# Patient Record
Sex: Male | Born: 1975 | Race: White | Hispanic: No | Marital: Single | Smoking: Former smoker
Health system: Southern US, Community
[De-identification: ages and names within clinical notes are randomized; demographics above are authoritative.]

## PROBLEM LIST (undated history)

## (undated) DIAGNOSIS — M1990 Unspecified osteoarthritis, unspecified site: Secondary | ICD-10-CM

## (undated) DIAGNOSIS — F329 Major depressive disorder, single episode, unspecified: Secondary | ICD-10-CM

## (undated) DIAGNOSIS — F419 Anxiety disorder, unspecified: Secondary | ICD-10-CM

## (undated) DIAGNOSIS — K219 Gastro-esophageal reflux disease without esophagitis: Secondary | ICD-10-CM

## (undated) DIAGNOSIS — I10 Essential (primary) hypertension: Secondary | ICD-10-CM

## (undated) DIAGNOSIS — F32A Depression, unspecified: Secondary | ICD-10-CM

## (undated) HISTORY — PX: CIRCUMCISION: SUR203

## (undated) HISTORY — DX: Depression, unspecified: F32.A

## (undated) HISTORY — DX: Major depressive disorder, single episode, unspecified: F32.9

## (undated) HISTORY — DX: Essential (primary) hypertension: I10

## (undated) HISTORY — DX: Gastro-esophageal reflux disease without esophagitis: K21.9

## (undated) HISTORY — DX: Unspecified osteoarthritis, unspecified site: M19.90

## (undated) HISTORY — DX: Anxiety disorder, unspecified: F41.9

---

## 2017-07-26 ENCOUNTER — Encounter: Payer: Self-pay | Admitting: Gastroenterology

## 2017-08-01 ENCOUNTER — Encounter: Payer: Self-pay | Admitting: Gastroenterology

## 2017-09-29 ENCOUNTER — Encounter (INDEPENDENT_AMBULATORY_CARE_PROVIDER_SITE_OTHER): Payer: Self-pay

## 2017-09-29 ENCOUNTER — Other Ambulatory Visit (INDEPENDENT_AMBULATORY_CARE_PROVIDER_SITE_OTHER): Payer: Medicare Other

## 2017-09-29 ENCOUNTER — Ambulatory Visit: Payer: Medicare Other | Admitting: Gastroenterology

## 2017-09-29 ENCOUNTER — Encounter: Payer: Self-pay | Admitting: Gastroenterology

## 2017-09-29 VITALS — BP 128/88 | HR 78 | Ht 70.0 in | Wt 222.5 lb

## 2017-09-29 DIAGNOSIS — R7989 Other specified abnormal findings of blood chemistry: Secondary | ICD-10-CM

## 2017-09-29 DIAGNOSIS — R7401 Elevation of levels of liver transaminase levels: Secondary | ICD-10-CM

## 2017-09-29 DIAGNOSIS — K219 Gastro-esophageal reflux disease without esophagitis: Secondary | ICD-10-CM | POA: Diagnosis not present

## 2017-09-29 DIAGNOSIS — K529 Noninfective gastroenteritis and colitis, unspecified: Secondary | ICD-10-CM

## 2017-09-29 DIAGNOSIS — R740 Nonspecific elevation of levels of transaminase and lactic acid dehydrogenase [LDH]: Secondary | ICD-10-CM | POA: Diagnosis not present

## 2017-09-29 DIAGNOSIS — R945 Abnormal results of liver function studies: Secondary | ICD-10-CM | POA: Diagnosis not present

## 2017-09-29 DIAGNOSIS — R197 Diarrhea, unspecified: Secondary | ICD-10-CM | POA: Diagnosis not present

## 2017-09-29 LAB — HEPATIC FUNCTION PANEL
ALT: 44 U/L (ref 0–53)
AST: 30 U/L (ref 0–37)
Albumin: 4.5 g/dL (ref 3.5–5.2)
Alkaline Phosphatase: 71 U/L (ref 39–117)
BILIRUBIN TOTAL: 0.4 mg/dL (ref 0.2–1.2)
Bilirubin, Direct: 0.1 mg/dL (ref 0.0–0.3)
Total Protein: 7.3 g/dL (ref 6.0–8.3)

## 2017-09-29 LAB — IGA: IgA: 318 mg/dL (ref 68–378)

## 2017-09-29 LAB — FERRITIN: FERRITIN: 133.4 ng/mL (ref 22.0–322.0)

## 2017-09-29 LAB — GAMMA GT: GGT: 31 U/L (ref 7–51)

## 2017-09-29 LAB — HIGH SENSITIVITY CRP: CRP, High Sensitivity: 1.29 mg/L (ref 0.000–5.000)

## 2017-09-29 LAB — TSH: TSH: 0.92 u[IU]/mL (ref 0.35–4.50)

## 2017-09-29 NOTE — Progress Notes (Signed)
[place] VISIT   Primary Care Provider Katherina Mires, MD [address] [place] [id number] [phone]  Referring Provider Katherina Mires, MD [place], [place] [address] [phone]  Patient Profile: Steven Shaw is a 42 y.o. male with a pmh significant for Schizophrenia, MDD/Anxiety, HTN, GERD, OA.  The patient presents to the [facility] for an evaluation and management of problem(s) noted below:  Problem List 1. Chronic diarrhea of unknown origin   2. Elevated LFTs   3. Elevated AST (SGOT)   4. Gastroesophageal reflux disease, esophagitis presence not specified     History of Present Illness: This is the patient's first visit to the GI [place] clinic.  He is being seen for evaluation of chronic diarrhea.  Patient describes experiencing for the last 5 to 6 years loose, diarrheal bowel movements on a daily basis.  He has between 3-4 bowel movements per day that are extremely soft Bristol scale 6 and it is very infrequent when it is a Social worker scale 7 or watery.  He describes no significant abdominal pain or discomfort associated with his diarrhea/increased bowel movements.  He does relate a relatively quick gastrocolic reflex such that within 15 to 20 minutes of eating he will usually have a bowel movement.  He had no melena or hematochezia.  He trialed fiber supplementation about once daily for about 1 month and felt that his stools became of slightly more bulky but he still had as frequent a number of bowel movements per day that he began to try and decrease his fiber intake to what it is now only 2 times weekly to see if he truly noticed a change in the frequency.  He is only tried Metamucil.  The patient does not describe any significant travel history outside of the country.  He does not have anyone in his family or anyone around him who is had similar manifestations of increased  frequency/diarrhea.  He does describe a long-standing history of acid reflux.  His acid reflux/pyrosis is something that occurs if he does not watch his diet closely.  He has been on a PPI and while on it has not had any breakthrough symptoms.  He denies any overt dysphagia or odynophagia.  He has never had an upper or lower endoscopy.  The patient denies any symptoms of decompensated liver disease including pruritus, deepening jaundice, dark and urine, confusion.  He has never had an abdominal ultrasound or CT of his abdomen.  If no family history of liver disease.  GI Review of Systems Positive as above Negative for change in appetite, weight loss, nausea, vomiting  Review of Systems General: Denies fevers/chills/weight loss HEENT: Denies oral lesions Cardiovascular: Denies chest pain Pulmonary: Denies shortness of breath Gastroenterological: See HPI Genitourinary: Denies hematuria or darkened urine Hematological: Denies easy bruising/bleeding Endocrine: Denies temperature intolerance Dermatological: Denies skin changes Psychological: He feels mood is stable on current medication dosing Musculoskeletal: Denies any new arthralgias   Medications Current Outpatient Medications  Medication Sig Dispense Refill  . FLUoxetine (PROZAC) 20 MG capsule Take 40 mg by mouth daily.    Marland Kitchen omeprazole (PRILOSEC) 20 MG capsule Take 20 mg by mouth daily.    . QUEtiapine (SEROQUEL) 400 MG tablet Take 800 mg by mouth at bedtime.    . risperidone (RISPERDAL) 4 MG tablet Take 4 mg by mouth daily.     No current facility-administered medications for this visit.    Allergies  No Known Allergies  Histories Past Medical History:  Diagnosis Date  . Anxiety   . Arthritis   . Depressed   . High blood pressure    History reviewed. No pertinent surgical history. Social History   Socioeconomic History  . Marital status: Single    Spouse name: Not on file  . Number of children: 0  . Years of education:  Not on file  . Highest education level: Not on file  Occupational History  . Occupation: Disabled  Social Needs  . Financial resource strain: Not on file  . Food insecurity:    Worry: Not on file    Inability: Not on file  . Transportation needs:    Medical: Not on file    Non-medical: Not on file  Tobacco Use  . Smoking status: Former Research scientist (life sciences)  . Smokeless tobacco: Never Used  Substance and Sexual Activity  . Alcohol use: Never    Frequency: Never  . Drug use: Yes  . Sexual activity: Not on file  Lifestyle  . Physical activity:    Days per week: Not on file    Minutes per session: Not on file  . Stress: Not on file  Relationships  . Social connections:    Talks on phone: Not on file    Gets together: Not on file    Attends religious service: Not on file    Active member of club or organization: Not on file    Attends meetings of clubs or organizations: Not on file    Relationship status: Not on file  . Intimate partner violence:    Fear of current or ex partner: Not on file    Emotionally abused: Not on file    Physically abused: Not on file    Forced sexual activity: Not on file  Other Topics Concern  . Not on file  Social History Narrative  . Not on file   Family History  Problem Relation Age of Onset  . Colon cancer Neg Hx   . Esophageal cancer Neg Hx   . Irritable bowel syndrome Neg Hx   . Liver disease Neg Hx   . Pancreatic cancer Neg Hx    I have reviewed his medical, social, and family history in detail and updated the electronic medical record as necessary.    PHYSICAL EXAMINATION  BP 128/88   Pulse 78   Ht 5\' 10"  (1.778 m)   Wt 222 lb 8 oz (100.9 kg)   BMI 31.93 kg/m  GEN: NAD, appears stated age, doesn't appear chronically ill PSYCH: Cooperative, without pressured speech EYE: Conjunctivae pink, sclerae anicteric ENT: MMM, without oral ulcers, no erythema or exudates noted NECK: Supple, without thyromegaly, no JVD CV: RR without R/Gs  RESP:  CTAB posteriorly, without wheezing GI: NABS, soft, NT/ND, without rebound or guarding, no HSM appreciated GU: DRE shows MSK/EXT: _ edema, no palmar erythema, no Dupuytren's contractures SKIN: No concerning rashes/lesions, no spider angiomata HEME: No LAD appreciated NEURO:  Alert & Oriented x 3, no focal deficits, no evidence of asterixis   REVIEW OF DATA  I reviewed the following data at the time of this encounter:  GI Procedures and Studies  None to review  Laboratory Studies  Reviewed in Care Everywhere  Imaging Studies  No relevant studies   ASSESSMENT  Mr. Costanzo is a 42 y.o. male with a pmh significant for Schizophrenia, MDD/Anxiety, HTN, GERD, OA.  The patient is seen today for evaluation and management of:  1. Chronic  diarrhea of unknown origin   2. Elevated LFTs   3. Elevated AST (SGOT)   4. Gastroesophageal reflux disease, esophagitis presence not specified    This is a hemodynamically stable patient who is being seen evaluation for abnormal liver biochemical testing as well as chronic diarrhea.  In regards to his chronic diarrhea which is the patient's main concern he describes an increased frequency of looser/softer bowel movements that have been ongoing for the last 5 to 6 years.  The etiology of this change in his bowel habits when he previously was only going 1-2 times per day is not clearly delineated just with the history that he is given.  We will plan to obtain stool studies to rule out an infectious etiology (this is very unlikely based on the chronicity of things).  We will also plan to increase his fiber supplementation over the course the next few weeks.  He has not had overt red flag symptoms in regards to any blood loss or blood being seen and his weight has been stable.  Thus I do not think he is having a malabsorptive process.  We will work-up metabolic etiologies as per laboratory evaluation noted below.  If after the next 6 weeks he continues to have  significant loose stools we will consider an endoscopic evaluation to rule out microscopic colitis.  I do not think he has SIBO but we will keep that in mind as well.  He has not used antimotility agents which I think is okay at this point in time but may need to consider if this turns out to be a functional diarrhea.  In regards to his abnormal liver biochemical testing he only has 2 sets of labs and describes no other history of having had abnormal liver biochemical testing in the past.  Does have a history of prior drug use though has been found to be hepatitis C and hepatitis B antibody negative.  No significant alcohol consumption or family history of liver disease is noted.  I suspect that he likely has an element of nonalcoholic fatty liver disease.  We will plan to obtain a basic work-up for abnormal liver biochemical testing and plan for a liver ultrasound.  If the patient continues to have waxing/waning liver test abnormalities over the course the next 6 to 12 months and he remained stable at that point I would strongly suggest the consideration of a liver biopsy and additional testing as necessary.  All questions were answered to the best of my ability and the patient agrees with this plan of action with follow-up as outlined below.   PLAN  1. Chronic diarrhea of unknown origin - Stool culture; Future - Ova and parasite examination; Future - Clostridium difficile Toxin B, Qualitative, Real-Time PCR; Future - Gastrointestinal Pathogen Panel PCR; Future - TSH; Future - Increase Fiber to twice daily - Keep a diarrhea diary - If no etiology is found or symptoms persist, will consider Colonoscopy with biopsies  2. Elevated LFTs - Hepatic function panel; Future - High sensitivity CRP; Future - Ferritin; Future - Tissue transglutaminase, IgA; Future - IgA; Future - Gamma GT; Future - Mitochondrial antibodies; Future - ANA; Future - Anti-smooth muscle antibody, IgG; Future - Hepatitis B  core antibody, total; Future - Hepatitis B surface antibody,qualitative; Future - Hepatitis A antibody, total; Future - US Abdomen Limited RUQ; Future  3. Elevated AST (SGOT)  4. Gastroesophageal reflux disease, esophagitis presence not specified - Continue current PPI dosing - No need for endoscopic  evaluation at this point in time for reflux   Orders Placed This Encounter  Procedures  . Stool culture  . Ova and parasite examination  . US Abdomen Limited RUQ  . Hepatic function panel  . TSH  . High sensitivity CRP  . Ferritin  . Tissue transglutaminase, IgA  . IgA  . Gamma GT  . Mitochondrial antibodies  . ANA  . Anti-smooth muscle antibody, IgG  . Hepatitis B core antibody, total  . Hepatitis B surface antibody,qualitative  . Hepatitis A antibody, total  . Clostridium difficile Toxin B, Qualitative, Real-Time PCR  . Gastrointestinal Pathogen Panel PCR    New Prescriptions   No medications on file   Modified Medications   No medications on file    Planned Follow Up: No follow-ups on file.   Justice Britain, MD [facility] Office # [phone]

## 2017-09-29 NOTE — Patient Instructions (Signed)
Your provider has requested that you go to the basement level for lab work before leaving today. Press "B" on the elevator. The lab is located at the first door on the left as you exit the elevator.  You have been scheduled for an abdominal ultrasound at [facility] (1st floor of hospital) on 10/04/17 at 8am. Please arrive 15 minutes prior to your appointment for registration. Make certain not to have anything to eat or drink 6 hours prior to your appointment. Should you need to reschedule your appointment, please contact radiology at [phone]. This test typically takes about 30 minutes to perform.  Increase Metammcil to twice daily  We have scheduled you for a follow up appointment with Dr Rush Landmark on 11/09/17 at 11am  Normal BMI (Body Mass Index- based on height and weight) is between 19 and 25. Your BMI today is Body mass index is 31.93 kg/m. Marland Kitchen Please consider follow up  regarding your BMI with your Primary Care Provider.

## 2017-10-01 ENCOUNTER — Encounter: Payer: Self-pay | Admitting: Gastroenterology

## 2017-10-04 ENCOUNTER — Ambulatory Visit (HOSPITAL_COMMUNITY)
Admission: RE | Admit: 2017-10-04 | Discharge: 2017-10-04 | Disposition: A | Discharge status: Home / Self Care | Payer: Medicare Other | Source: Ambulatory Visit | Attending: Gastroenterology | Admitting: Gastroenterology

## 2017-10-04 DIAGNOSIS — K219 Gastro-esophageal reflux disease without esophagitis: Secondary | ICD-10-CM | POA: Diagnosis present

## 2017-10-04 DIAGNOSIS — K760 Fatty (change of) liver, not elsewhere classified: Secondary | ICD-10-CM | POA: Diagnosis not present

## 2017-10-04 DIAGNOSIS — R7989 Other specified abnormal findings of blood chemistry: Secondary | ICD-10-CM

## 2017-10-04 DIAGNOSIS — K529 Noninfective gastroenteritis and colitis, unspecified: Secondary | ICD-10-CM | POA: Insufficient documentation

## 2017-10-04 DIAGNOSIS — R945 Abnormal results of liver function studies: Secondary | ICD-10-CM | POA: Insufficient documentation

## 2017-10-05 ENCOUNTER — Encounter: Payer: Self-pay | Admitting: Gastroenterology

## 2017-10-09 ENCOUNTER — Encounter: Payer: Self-pay | Admitting: Gastroenterology

## 2017-10-09 DIAGNOSIS — R768 Other specified abnormal immunological findings in serum: Secondary | ICD-10-CM | POA: Insufficient documentation

## 2017-10-09 NOTE — Progress Notes (Signed)
Please let patient know that most of his lab results from a liver perspective were normal. His elevation in the AMA antibody can go along with some Liver disorders, but Primary Biliary Cholangitis seems much less likely in his age group/gender/situation. We will hold on a liver biopsy for now, but I will make notation in chart about positive AMA.  We will consider role of a MRI/MRCP after his next visit. In regards to his stool studies they are all negative.  Based on how he is doing overall at next visit to consider role of endoscopic evaluation. Thank you.

## 2017-10-10 LAB — ANTI-SMOOTH MUSCLE ANTIBODY, IGG

## 2017-10-10 LAB — STOOL CULTURE
MICRO NUMBER: 91082454
MICRO NUMBER: 91082455
MICRO NUMBER:: 91082457
SHIGA RESULT: NOT DETECTED
SPECIMEN QUALITY: ADEQUATE
SPECIMEN QUALITY: ADEQUATE
SPECIMEN QUALITY:: ADEQUATE

## 2017-10-10 LAB — CLOSTRIDIUM DIFFICILE TOXIN B, QUALITATIVE, REAL-TIME PCR

## 2017-10-10 LAB — OVA AND PARASITE EXAMINATION
CONCENTRATE RESULT:: NONE SEEN
MICRO NUMBER:: 91082456
SPECIMEN QUALITY: ADEQUATE
TRICHROME RESULT: NONE SEEN

## 2017-10-10 LAB — GASTROINTESTINAL PATHOGEN PANEL PCR
C. difficile Tox A/B, PCR: NOT DETECTED
CAMPYLOBACTER, PCR: NOT DETECTED
Cryptosporidium, PCR: NOT DETECTED
E COLI (ETEC) LT/ST, PCR: NOT DETECTED
E coli (STEC) stx1/stx2, PCR: NOT DETECTED
E coli 0157, PCR: NOT DETECTED
GIARDIA LAMBLIA, PCR: NOT DETECTED
Norovirus, PCR: NOT DETECTED
ROTAVIRUS, PCR: NOT DETECTED
SHIGELLA, PCR: NOT DETECTED
Salmonella, PCR: NOT DETECTED

## 2017-10-10 LAB — MITOCHONDRIAL ANTIBODIES: Mitochondrial M2 Ab, IgG: 61.4 U — ABNORMAL HIGH

## 2017-10-10 LAB — HEPATITIS B CORE ANTIBODY, TOTAL: Hep B Core Total Ab: NONREACTIVE

## 2017-10-10 LAB — ANA: Anti Nuclear Antibody(ANA): NEGATIVE

## 2017-10-10 LAB — TISSUE TRANSGLUTAMINASE, IGA: (tTG) Ab, IgA: 1 U/mL

## 2017-10-10 LAB — HEPATITIS B SURFACE ANTIBODY,QUALITATIVE: HEP B S AB: NONREACTIVE

## 2017-10-10 LAB — HEPATITIS A ANTIBODY, TOTAL: HEPATITIS A AB,TOTAL: REACTIVE — AB

## 2017-11-09 ENCOUNTER — Encounter (INDEPENDENT_AMBULATORY_CARE_PROVIDER_SITE_OTHER): Payer: Self-pay

## 2017-11-09 ENCOUNTER — Ambulatory Visit (INDEPENDENT_AMBULATORY_CARE_PROVIDER_SITE_OTHER): Payer: Medicare Other | Admitting: Gastroenterology

## 2017-11-09 ENCOUNTER — Encounter: Payer: Self-pay | Admitting: Gastroenterology

## 2017-11-09 VITALS — BP 134/90 | HR 80 | Ht 70.0 in | Wt 226.0 lb

## 2017-11-09 DIAGNOSIS — K760 Fatty (change of) liver, not elsewhere classified: Secondary | ICD-10-CM

## 2017-11-09 DIAGNOSIS — R945 Abnormal results of liver function studies: Secondary | ICD-10-CM | POA: Diagnosis not present

## 2017-11-09 DIAGNOSIS — K529 Noninfective gastroenteritis and colitis, unspecified: Secondary | ICD-10-CM | POA: Diagnosis not present

## 2017-11-09 DIAGNOSIS — R7989 Other specified abnormal findings of blood chemistry: Secondary | ICD-10-CM

## 2017-11-09 MED ORDER — PEG 3350-KCL-NABCB-NACL-NASULF 236 G PO SOLR: 4000.0000 mL | Freq: Once | ORAL | 0 refills | Status: AC

## 2017-11-09 NOTE — Progress Notes (Signed)
[name] VISIT   Primary Care Provider Katherina Mires, MD [place] [address] [phone]  Patient Profile: Steven Shaw is a 42 y.o. male with a pmh significant for Schizophrenia, MDD/Anxiety, HTN, GERD, OA, Chronic Diarrhea.  The patient presents to the [facility] for an evaluation and management of problem(s) noted below:  Problem List 1. Chronic diarrhea   2. Fatty liver   3. Elevated LFTs     History of Present Illness: Please see initial consultation/outpatient referral for full details of present illness.  In brief, the aptient has had loose stools for the last 5-6 years occurring daily.  Normal Bristol scale stools have been within the 6-7 range.  His symptoms have been related to a relatively quick gastrocolic reflex.  No history of significant hematochezia or melena.  Intermittent fiber was not helpful.  His pyrosis symptoms have been diet controlled while on PPI.  When last seen decision was made to move forward with serolgica and stool studies to work up initial set of possibilities and these have been negative to date.    Interval Hisotry: The patient returns today and is feeling well and grossly unchanged in regards to his overall number of bowel movements daily.  He has continued to use Fiber supplementation twice daily and although there have been times where there was some form to the stools, they remain mostly Bristol 5-6 in quality.  He is having between 3-5 bowel movements daily.  Remains free of any blood.  His weight has been stable.  His appetite remains well and weight is been stable.  GI Review of Systems Positive as above Negative for weight loss, nausea, vomiting, melena, hematochezia  Review of Systems General: Denies fevers/chills Cardiovascular: Denies chest pain/palpitations Pulmonary: Denies shortness of breath Gastroenterological: See HPI Genitourinary: Denies dark  and urine Psychological: Mood is stable   Medications Current Outpatient Medications  Medication Sig Dispense Refill  . FLUoxetine (PROZAC) 20 MG capsule Take 40 mg by mouth daily.    Marland Kitchen omeprazole (PRILOSEC) 20 MG capsule Take 20 mg by mouth daily.    . QUEtiapine (SEROQUEL) 400 MG tablet Take 800 mg by mouth at bedtime.    . risperidone (RISPERDAL) 4 MG tablet Take 4 mg by mouth daily.     No current facility-administered medications for this visit.    Allergies No Known Allergies  Histories Past Medical History:  Diagnosis Date  . Anxiety   . Arthritis   . Depressed   . High blood pressure    History reviewed. No pertinent surgical history. Social History   Socioeconomic History  . Marital status: Single    Spouse name: Not on file  . Number of children: 0  . Years of education: Not on file  . Highest education level: Not on file  Occupational History  . Occupation: Disabled  Social Needs  . Financial resource strain: Not on file  . Food insecurity:    Worry: Not on file    Inability: Not on file  . Transportation needs:    Medical: Not on file    Non-medical: Not on file  Tobacco Use  . Smoking status: Former Research scientist (life sciences)  . Smokeless tobacco: Never Used  Substance and Sexual Activity  . Alcohol use: Never    Frequency: Never  . Drug use: Yes    Types: Marijuana, Cocaine  . Sexual activity: Not on file  Lifestyle  . Physical activity:    Days  per week: Not on file    Minutes per session: Not on file  . Stress: Not on file  Relationships  . Social connections:    Talks on phone: Not on file    Gets together: Not on file    Attends religious service: Not on file    Active member of club or organization: Not on file    Attends meetings of clubs or organizations: Not on file    Relationship status: Not on file  . Intimate partner violence:    Fear of current or ex partner: Not on file    Emotionally abused: Not on file    Physically abused: Not on file     Forced sexual activity: Not on file  Other Topics Concern  . Not on file  Social History Narrative  . Not on file   Family History  Problem Relation Age of Onset  . Colon cancer Neg Hx   . Esophageal cancer Neg Hx   . Irritable bowel syndrome Neg Hx   . Liver disease Neg Hx   . Pancreatic cancer Neg Hx   . Inflammatory bowel disease Neg Hx   . Rectal cancer Neg Hx   . Stomach cancer Neg Hx    I have reviewed his medical, social, and family history in detail and updated the electronic medical record as necessary.    PHYSICAL EXAMINATION  BP 134/90   Pulse 80   Ht 5\' 10"  (1.778 m)   Wt 226 lb (102.5 kg)   BMI 32.43 kg/m  GEN: NAD, appears stated age, doesn't appear chronically ill PSYCH: Cooperative, without pressured speech EYE: Conjunctivae pink, sclerae anicteric ENT: MMM, without oral ulcers NECK: Supple CV: RR without R/Gs  RESP: CTAB posteriorly, without wheezing GI: NABS, soft, NT/ND, without rebound or guarding, no HSM appreciated MSK/EXT: No lower extremity edema SKIN: No jaundice NEURO:  Alert & Oriented x 3, no focal deficitss   REVIEW OF DATA  I reviewed the following data at the time of this encounter:  GI Procedures and Studies  None to review  Laboratory Studies  Reviewed in EPIC  Imaging Studies  9/19 RUQUS IMPRESSION: 1. Changes of hepatic steatosis. No focal hepatic abnormality other than some sparing near the gallbladder. 2. No gallstones.   ASSESSMENT  Steven Shaw is a 42 y.o. male  with a pmh significant for Schizophrenia, MDD/Anxiety, HTN, GERD, OA, Chronic Diarrhea.  The patient is seen today for evaluation and management of:  1. Chronic diarrhea   2. Fatty liver   3. Elevated LFTs    The patient remains hemodynamically stable since his prior evaluation.  His diarrhea has not changed significantly since his initial evaluation prior consultation note has been long-standing overall.  As we have not found an etiology as of yet it is  necessary to proceed with a colonoscopy to further evaluate patient for etiologies such as microscopic colitis and inflammatory bowel disease.  If this work-up is negative I would consider breath testing although he is not the typical patient I would consider an etiology for SIBO to be playing a role but I think it would be reasonable in his work-up if necessary.  The risks and benefits of endoscopic evaluation were discussed with the patient; these include but are not limited to the risk of perforation, infection, bleeding, missed lesions, lack of diagnosis, severe illness requiring hospitalization, as well as anesthesia and sedation related illnesses.  The patient is agreeable to proceed.  We also discussed that  on his last evaluation he had liver tests that had improvement and will no longer in the above normal range.  With that being said his transferases are not in the teens or 20s and thus based on AASLD guidelines would be still slightly abnormal.  He had his laboratory work-up a positive AMA.  We obtained a right upper quadrant ultrasound that showed evidence of fatty liver disease.  I do not think a liver biopsy is required at this point in time since he is now in a more normal range of his liver tests.  We discussed that if his liver numbers were to fluctuate again in the future then it may be reasonable to consider a liver biopsy.  Otherwise we will continue continue to maintain a healthy lifestyle and hopefully work on his weight to decrease his chance of having inflammation as a result of fatty liver.  We will not pursue a MRI yet for his positive ANA because I do not think he is someone who has primary biliary cholangitis based on his age and sex.  All patient questions were answered, to the best of my ability, and the patient agrees to the aforementioned plan of action with follow-up as indicated.   PLAN  1. Chronic diarrhea - Colonoscopy to rule out IBD and Microscopic colitis - Consider  Pancreatic fecal elastase for future testing. - Maintain Fiber twice daily supplementation - Keep a diarrhea diary  2. Fatty liver - Lifestyle modifications for continued weight loss - Consider Mediterranean Diet - Alcohol minimization  3. Elevated LFTs - Likely Fatty Liver Disease - Hold on Liver biopsy for now - Hold on MRI/MRCP for now unless LFTs derange - Unlikely PBC even with positive AMA    Orders Placed This Encounter  Procedures  . Ambulatory referral to Gastroenterology    New Prescriptions   No medications on file   Modified Medications   No medications on file    Planned Follow Up: No follow-ups on file.   Justice Britain, MD [facility] Office # [phone]

## 2017-11-09 NOTE — Patient Instructions (Signed)

## 2017-11-11 ENCOUNTER — Encounter: Payer: Self-pay | Admitting: Gastroenterology

## 2017-11-11 DIAGNOSIS — K760 Fatty (change of) liver, not elsewhere classified: Secondary | ICD-10-CM | POA: Insufficient documentation

## 2017-12-07 ENCOUNTER — Telehealth: Payer: Self-pay | Admitting: Gastroenterology

## 2017-12-07 NOTE — Telephone Encounter (Signed)
Advised Dr. Rush Landmark in person that this patient had cancelled his procedure for tomorrow due to insurance issues.  He is aware now.

## 2017-12-08 ENCOUNTER — Encounter: Payer: Medicare Other | Admitting: Gastroenterology

## 2017-12-16 ENCOUNTER — Ambulatory Visit: Payer: Medicare Other | Admitting: Gastroenterology

## 2018-09-21 ENCOUNTER — Encounter: Payer: Self-pay | Admitting: Gastroenterology

## 2018-10-04 ENCOUNTER — Other Ambulatory Visit: Payer: Self-pay

## 2018-10-04 ENCOUNTER — Ambulatory Visit (AMBULATORY_SURGERY_CENTER): Payer: Self-pay | Admitting: *Deleted

## 2018-10-04 VITALS — Temp 96.0°F | Ht 70.0 in | Wt 229.0 lb

## 2018-10-04 DIAGNOSIS — R945 Abnormal results of liver function studies: Secondary | ICD-10-CM

## 2018-10-04 DIAGNOSIS — R7989 Other specified abnormal findings of blood chemistry: Secondary | ICD-10-CM

## 2018-10-04 DIAGNOSIS — K529 Noninfective gastroenteritis and colitis, unspecified: Secondary | ICD-10-CM

## 2018-10-04 MED ORDER — PEG 3350-KCL-NA BICARB-NACL 420 G PO SOLR: 4000.0000 mL | Freq: Once | ORAL | 0 refills | Status: AC

## 2018-10-04 NOTE — Progress Notes (Signed)
No egg or soy allergy known to patient  No issues with past sedation with any surgeries  or procedures, no intubation problems  No diet pills per patient No home 02 use per patient  No blood thinners per patient  Pt denies issues with constipation  No A fib or A flutter  EMMI video sent to pt's e mail  

## 2018-10-05 ENCOUNTER — Encounter: Payer: Self-pay | Admitting: Gastroenterology

## 2018-10-18 ENCOUNTER — Telehealth: Payer: Self-pay

## 2018-10-18 NOTE — Telephone Encounter (Signed)
Pt answered NO to all covid Questions °

## 2018-10-18 NOTE — Telephone Encounter (Signed)
Covid-19 screening questions   Do you now or have you had a fever in the last 14 days?  Do you have any respiratory symptoms of shortness of breath or cough now or in the last 14 days?  Do you have any family members or close contacts with diagnosed or suspected Covid-19 in the past 14 days?  Have you been tested for Covid-19 and found to be positive?       

## 2018-10-19 ENCOUNTER — Other Ambulatory Visit: Payer: Self-pay

## 2018-10-19 ENCOUNTER — Ambulatory Visit (AMBULATORY_SURGERY_CENTER): Payer: Medicare HMO | Admitting: Gastroenterology

## 2018-10-19 ENCOUNTER — Encounter: Payer: Self-pay | Admitting: Gastroenterology

## 2018-10-19 VITALS — BP 119/67 | HR 77 | Temp 98.2°F | Resp 12 | Ht 70.0 in | Wt 229.0 lb

## 2018-10-19 DIAGNOSIS — R7989 Other specified abnormal findings of blood chemistry: Secondary | ICD-10-CM

## 2018-10-19 DIAGNOSIS — D123 Benign neoplasm of transverse colon: Secondary | ICD-10-CM

## 2018-10-19 DIAGNOSIS — K2980 Duodenitis without bleeding: Secondary | ICD-10-CM

## 2018-10-19 DIAGNOSIS — K529 Noninfective gastroenteritis and colitis, unspecified: Secondary | ICD-10-CM

## 2018-10-19 DIAGNOSIS — K648 Other hemorrhoids: Secondary | ICD-10-CM

## 2018-10-19 DIAGNOSIS — K3189 Other diseases of stomach and duodenum: Secondary | ICD-10-CM

## 2018-10-19 MED ORDER — SODIUM CHLORIDE 0.9 % IV SOLN: 500.0000 mL | Freq: Once | INTRAVENOUS | Status: DC

## 2018-10-19 NOTE — Progress Notes (Signed)
Report to PACU, RN, vss, BBS= Clear.  

## 2018-10-19 NOTE — Progress Notes (Signed)
Called to room to assist during endoscopic procedure.  Patient ID and intended procedure confirmed with present staff. Received instructions for my participation in the procedure from the performing physician.  

## 2018-10-19 NOTE — Patient Instructions (Signed)
Please see handouts given to you on Polyps, Hemorrhoids.     YOU HAD AN ENDOSCOPIC PROCEDURE TODAY AT [facility]:   Refer to the procedure report that was given to you for any specific questions about what was found during the examination.  If the procedure report does not answer your questions, please call your gastroenterologist to clarify.  If you requested that your care partner not be given the details of your procedure findings, then the procedure report has been included in a sealed envelope for you to review at your convenience later.  YOU SHOULD EXPECT: Some feelings of bloating in the abdomen. Passage of more gas than usual.  Walking can help get rid of the air that was put into your GI tract during the procedure and reduce the bloating. If you had a lower endoscopy (such as a colonoscopy or flexible sigmoidoscopy) you may notice spotting of blood in your stool or on the toilet paper. If you underwent a bowel prep for your procedure, you may not have a normal bowel movement for a few days.  Please Note:  You might notice some irritation and congestion in your nose or some drainage.  This is from the oxygen used during your procedure.  There is no need for concern and it should clear up in a day or so.  SYMPTOMS TO REPORT IMMEDIATELY:   Following lower endoscopy (colonoscopy or flexible sigmoidoscopy):  Excessive amounts of blood in the stool  Significant tenderness or worsening of abdominal pains  Swelling of the abdomen that is new, acute  Fever of 100F or higher   Following upper endoscopy (EGD)  Vomiting of blood or coffee ground material  New chest pain or pain under the shoulder blades  Painful or persistently difficult swallowing  New shortness of breath  Fever of 100F or higher  Black, tarry-looking stools  For urgent or emergent issues, a gastroenterologist can be reached at any hour by calling [phone].   DIET:  We do recommend a small meal  at first, but then you may proceed to your regular diet.  Drink plenty of fluids but you should avoid alcoholic beverages for 24 hours.  ACTIVITY:  You should plan to take it easy for the rest of today and you should NOT DRIVE or use heavy machinery until tomorrow (because of the sedation medicines used during the test).    FOLLOW UP: Our staff will call the number listed on your records 48-72 hours following your procedure to check on you and address any questions or concerns that you may have regarding the information given to you following your procedure. If we do not reach you, we will leave a message.  We will attempt to reach you two times.  During this call, we will ask if you have developed any symptoms of COVID 19. If you develop any symptoms (ie: fever, flu-like symptoms, shortness of breath, cough etc.) before then, please call [phone].  If you test positive for Covid 19 in the 2 weeks post procedure, please call and report this information to [place].    If any biopsies were taken you will be contacted by phone or by letter within the next 1-3 weeks.  Please call us at [phone] if you have not heard about the biopsies in 3 weeks.    SIGNATURES/CONFIDENTIALITY: You and/or your care partner have signed paperwork which will be entered into your electronic medical record.  These signatures attest to the fact that that  the information above on your After Visit Summary has been reviewed and is understood.  Full responsibility of the confidentiality of this discharge information lies with you and/or your care-partner.  Thank you for letting us take care of your healthcare needs today.

## 2018-10-19 NOTE — Progress Notes (Signed)
Pt's states no medical or surgical changes since previsit or office visit.  Temp taken by J[facility] VS taken by [facility]

## 2018-10-19 NOTE — Op Note (Signed)
[place] Patient Name: Steven Shaw Procedure Date: 10/19/2018 10:35 AM MRN: [id number] Endoscopist: Justice Britain , MD Age: 43 Referring MD:  Date of Birth: [birth year] Gender: Male Account #: [id number] Procedure:                Colonoscopy Indications:              Chronic diarrhea Medicines:                Monitored Anesthesia Care Procedure:                Pre-Anesthesia Assessment:                           - Prior to the procedure, a History and Physical                            was performed, and patient medications and                            allergies were reviewed. The patient's tolerance of                            previous anesthesia was also reviewed. The risks                            and benefits of the procedure and the sedation                            options and risks were discussed with the patient.                            All questions were answered, and informed consent                            was obtained. Prior Anticoagulants: The patient has                            taken no previous anticoagulant or antiplatelet                            agents. ASA Grade Assessment: II - A patient with                            mild systemic disease. After reviewing the risks                            and benefits, the patient was deemed in                            satisfactory condition to undergo the procedure.                           After obtaining informed consent, the colonoscope  was passed under direct vision. Throughout the                            procedure, the patient's blood pressure, pulse, and                            oxygen saturations were monitored continuously. The                            Colonoscope was introduced through the anus and                            advanced to the 8 cm into the ileum. The                            colonoscopy was performed without difficulty. The                             patient tolerated the procedure. The quality of the                            bowel preparation was good. The terminal ileum,                            ileocecal valve, appendiceal orifice, and rectum                            were photographed. Scope In: 10:55:40 AM Scope Out: 11:10:01 AM Scope Withdrawal Time: 0 hours 12 minutes 15 seconds  Total Procedure Duration: 0 hours 14 minutes 21 seconds  Findings:                 The digital rectal exam findings include                            hemorrhoids. Pertinent negatives include no                            palpable rectal lesions.                           The terminal ileum and ileocecal valve appeared                            normal. Biopsies were taken with a cold forceps for                            histology to rule out Crohn's disease.                           A 1 mm polyp was found in the transverse colon. The                            polyp was sessile. The polyp was removed with a  cold biopsy forceps. Resection and retrieval were                            complete.                           Normal mucosa was found in the entire colon                            otherwise. Biopsies for histology were taken with a                            cold forceps from the entire colon for evaluation                            of microscopic colitis and chronic colitis rule out.                           Non-bleeding non-thrombosed internal hemorrhoids                            were found during retroflexion, during perianal                            exam and during digital exam. The hemorrhoids were                            Grade II (internal hemorrhoids that prolapse but                            reduce spontaneously). Complications:            No immediate complications. Estimated Blood Loss:     Estimated blood loss was minimal. Impression:               -  Hemorrhoids found on digital rectal exam.                           - The examined portion of the ileum was normal.                            Biopsied.                           - One 1 mm polyp in the transverse colon, removed                            with a cold biopsy forceps. Resected and retrieved.                           - Normal mucosa in the entire examined colon                            otherwise. Biopsied.                           -  Non-bleeding non-thrombosed internal hemorrhoids. Recommendation:           - The patient will be observed post-procedure,                            until all discharge criteria are met.                           - Discharge patient to home.                           - Patient has a contact number available for                            emergencies. The signs and symptoms of potential                            delayed complications were discussed with the                            patient. Return to normal activities tomorrow.                            Written discharge instructions were provided to the                            patient.                           - Resume previous diet.                           - Continue present medications.                           - Await pathology results.                           - Repeat colonoscopy in 7/10 years for surveillance                            based on pathology results and findings of                            adenomatous tissue.                           - The findings and recommendations were discussed                            with the patient. Justice Britain, MD 10/19/2018 11:22:13 AM

## 2018-10-19 NOTE — Op Note (Signed)
[place] Patient Name: Steven Shaw Procedure Date: 10/19/2018 10:35 AM MRN: [id number] Endoscopist: Justice Britain , MD Age: 43 Referring MD:  Date of Birth: 08-Dec-1975 Gender: Male Account #: [id number] Procedure:                Upper GI endoscopy Indications:              Gastro-esophageal reflux disease, Esophageal reflux                            symptoms that persist despite appropriate therapy,                            Diarrhea Medicines:                Monitored Anesthesia Care Procedure:                Pre-Anesthesia Assessment:                           - Prior to the procedure, a History and Physical                            was performed, and patient medications and                            allergies were reviewed. The patient's tolerance of                            previous anesthesia was also reviewed. The risks                            and benefits of the procedure and the sedation                            options and risks were discussed with the patient.                            All questions were answered, and informed consent                            was obtained. Prior Anticoagulants: The patient has                            taken no previous anticoagulant or antiplatelet                            agents. ASA Grade Assessment: II - A patient with                            mild systemic disease. After reviewing the risks                            and benefits, the patient was deemed in  satisfactory condition to undergo the procedure.                           After obtaining informed consent, the endoscope was                            passed under direct vision. Throughout the                            procedure, the patient's blood pressure, pulse, and                            oxygen saturations were monitored continuously. The                            Endoscope was introduced through the  mouth, and                            advanced to the second part of duodenum. The upper                            GI endoscopy was accomplished without difficulty.                            The patient tolerated the procedure. Scope In: Scope Out: Findings:                 No gross lesions were noted in the proximal                            esophagus and in the mid esophagus. Biopsies were                            taken with a cold forceps for histology to rule out                            EoE.                           Scattered islands of salmon-colored mucosa were                            present at 41 cm. No other visible abnormalities                            were present. Biopsies were taken with a cold                            forceps for histology to rule out Barrett's.                           The Z-line was irregular and was found 42 cm from  the incisors.                           Localized moderately erythematous mucosa without                            bleeding was found in the gastric body.                           No other gross lesions were noted in the entire                            examined stomach. Biopsies were taken with a cold                            forceps for histology and Helicobacter pylori                            testing.                           No gross lesions were noted in the duodenal bulb,                            in the first portion of the duodenum and in the                            second portion of the duodenum. Biopsies for                            histology were taken with a cold forceps for                            evaluation of celiac disease. Complications:            No immediate complications. Estimated Blood Loss:     Estimated blood loss was minimal. Impression:               - No gross lesions in esophagus proximally.                            Biopsied.                            - Salmon-colored mucosa suspicious for Barrett's                            esophagus. Biopsied to rule in/out.                           - Z-line irregular, 42 cm from the incisors.                           - Erythematous mucosa in the gastric body. Biopsied  for HP evaluation.                           - No gross lesions in the duodenal bulb, in the                            first portion of the duodenum and in the second                            portion of the duodenum. Biopsied. Recommendation:           - Proceed to scheduled colonoscopy.                           - Observe patient's clinical course.                           - Continue present medications.                           - Await pathology results.                           - Based on biopsies will dictate potential need of                            medication adjustments from a PPI perspective.                           - The findings and recommendations were discussed                            with the patient. Justice Britain, MD 10/19/2018 11:18:13 AM

## 2018-10-23 ENCOUNTER — Telehealth: Payer: Self-pay | Admitting: *Deleted

## 2018-10-23 ENCOUNTER — Telehealth: Payer: Self-pay

## 2018-10-23 NOTE — Telephone Encounter (Signed)
Attempted to reach patient for post-procedure f/u call. No answer. Left message that we will make another attempt to reach him again later today and for him to please not hesitate to call us if he has any questions/concerns regarding his care. 

## 2018-10-23 NOTE — Telephone Encounter (Signed)
  Follow up Call-  Call back number 10/19/2018  Post procedure Call Back phone  # [phone]  Permission to leave phone message Yes     Patient questions:  Do you have a fever, pain , or abdominal swelling? No. Pain Score  0 *  Have you tolerated food without any problems? Yes.    Have you been able to return to your normal activities? Yes.    Do you have any questions about your discharge instructions: Diet   No. Medications  No. Follow up visit  No.  Do you have questions or concerns about your Care? No.  Actions: * If pain score is 4 or above: No action needed, pain <4.  1. Have you developed a fever since your procedure? no  2.   Have you had an respiratory symptoms (SOB or cough) since your procedure? no  3.   Have you tested positive for COVID 19 since your procedure no  4.   Have you had any family members/close contacts diagnosed with the COVID 19 since your procedure?  no   If yes to any of these questions please route to Joylene John, RN and Alphonsa Gin, Therapist, sports.

## 2018-10-27 ENCOUNTER — Encounter: Payer: Self-pay | Admitting: Gastroenterology

## 2019-04-25 ENCOUNTER — Ambulatory Visit: Payer: Medicare HMO | Attending: Internal Medicine

## 2019-04-25 DIAGNOSIS — Z20822 Contact with and (suspected) exposure to covid-19: Secondary | ICD-10-CM

## 2019-04-26 LAB — NOVEL CORONAVIRUS, NAA: SARS-CoV-2, NAA: NOT DETECTED

## 2019-09-07 IMAGING — US US ABDOMEN LIMITED
1 series · 14 of 25 positions shown · non-contrast
Comparison: None.

CLINICAL DATA: Abnormal liver function tests, diarrhea

EXAM:
ULTRASOUND ABDOMEN LIMITED RIGHT UPPER QUADRANT

[Series 1: us abdomen limited · 0.22mm/px · 14 of 38 slices shown]
[im 1/38]
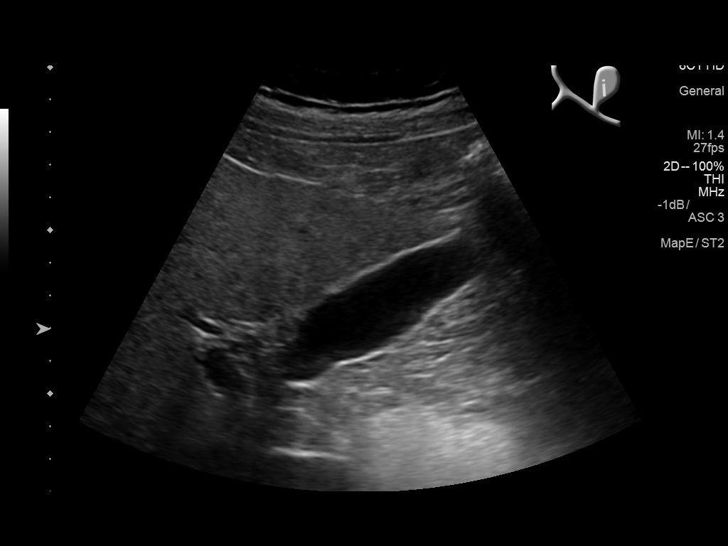
[im 4/38]
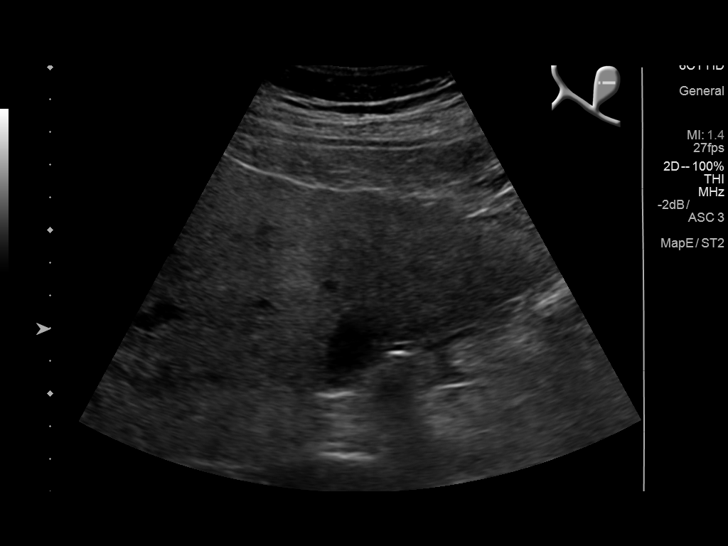
[im 7/38]
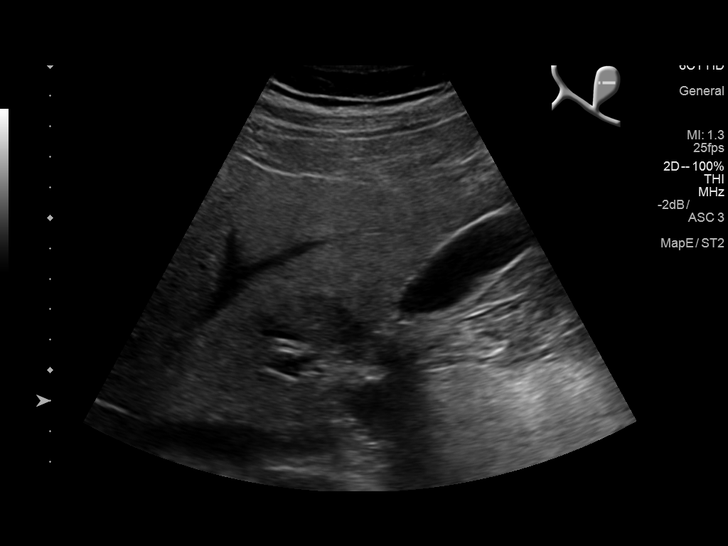
[im 10/38]
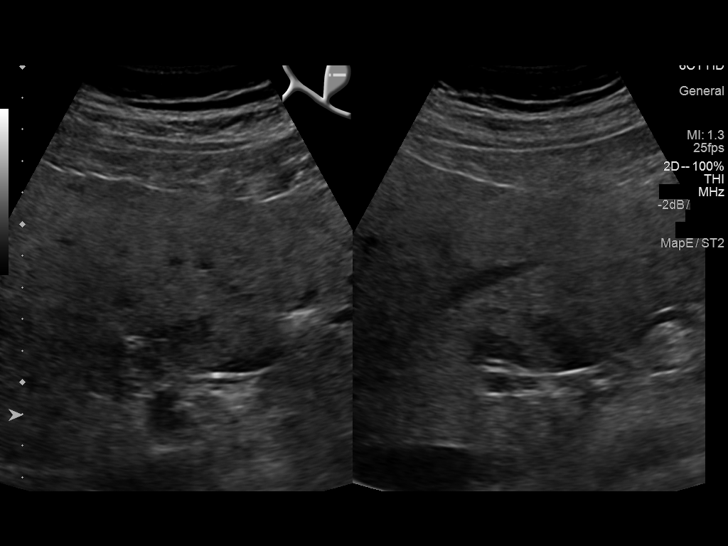
[im 13/38]
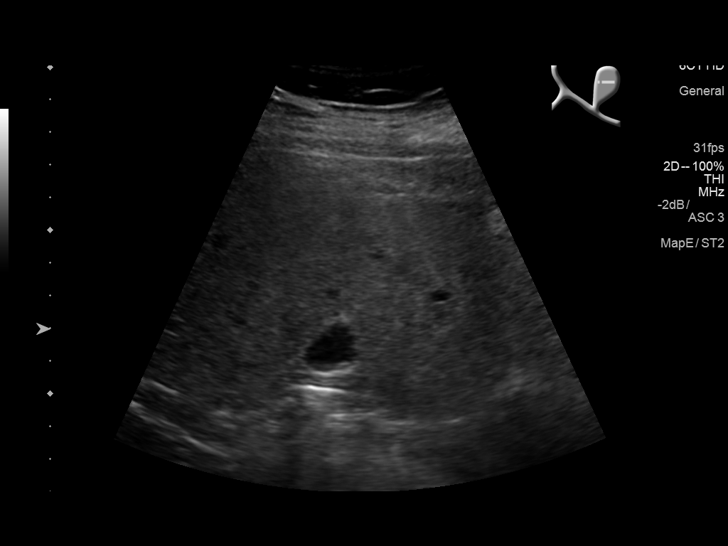
[im 14/38]
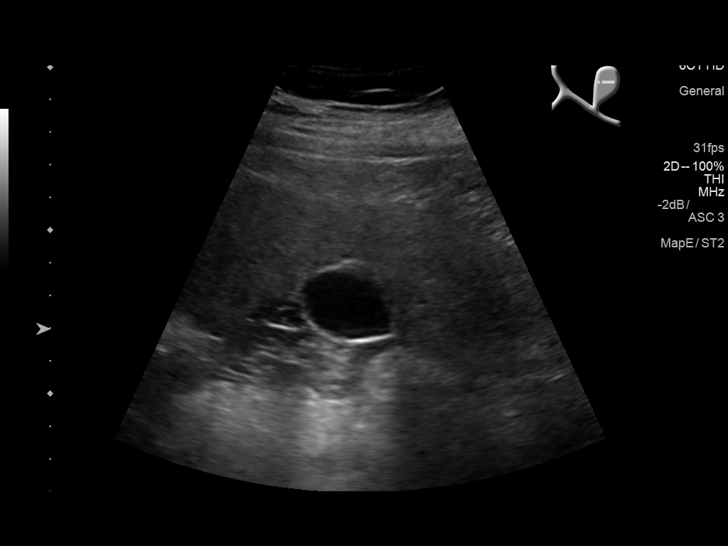
[im 17/38]
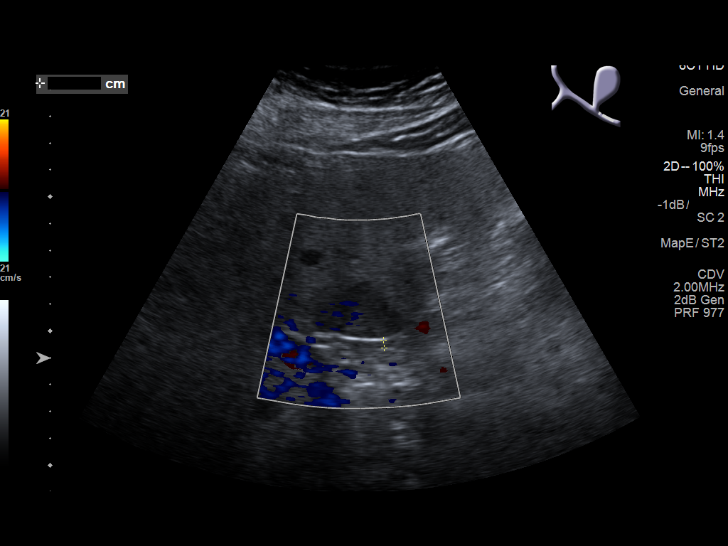
[im 21/38]
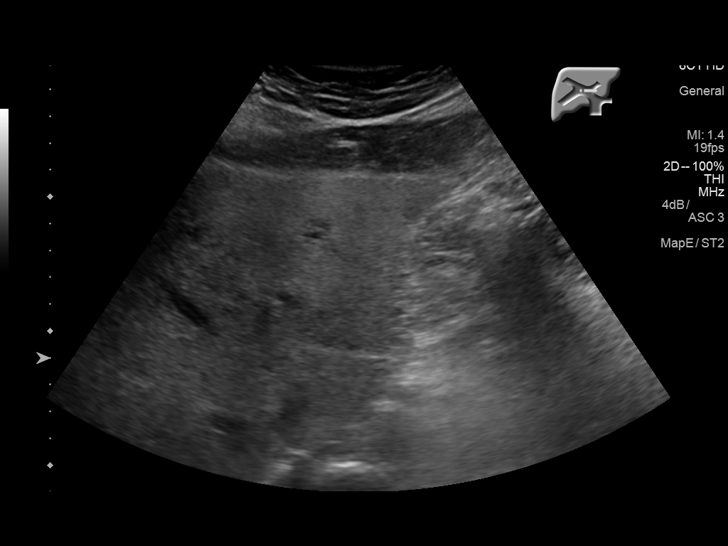
[im 24/38]
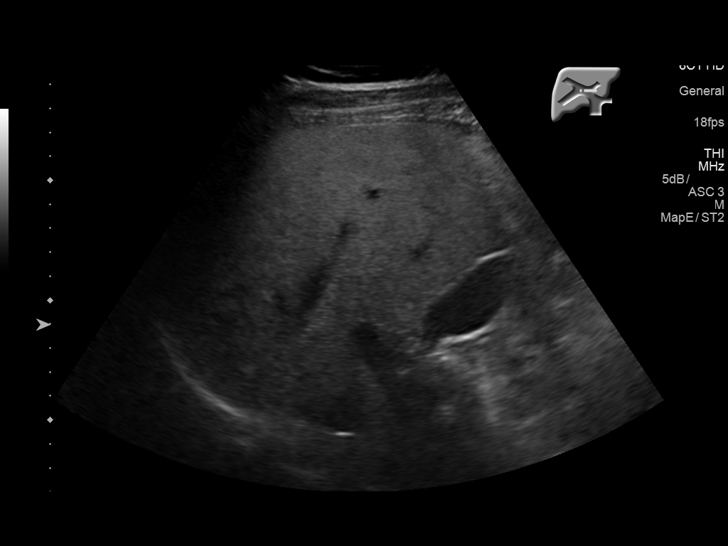
[im 25/38]
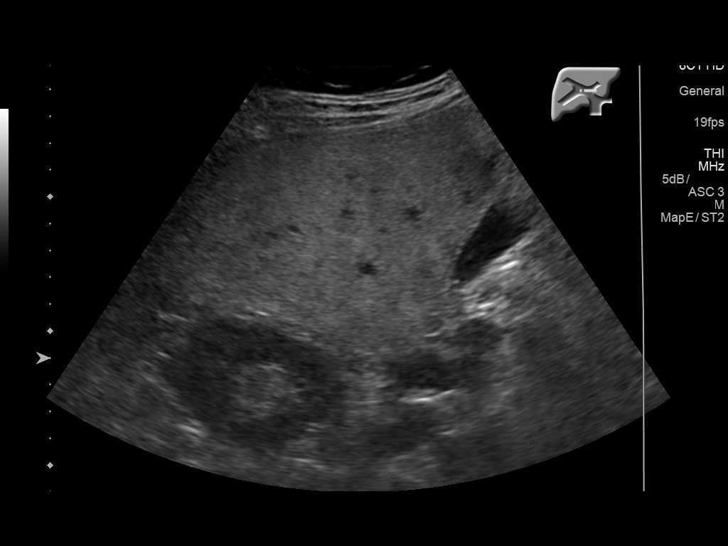
[im 28/38]
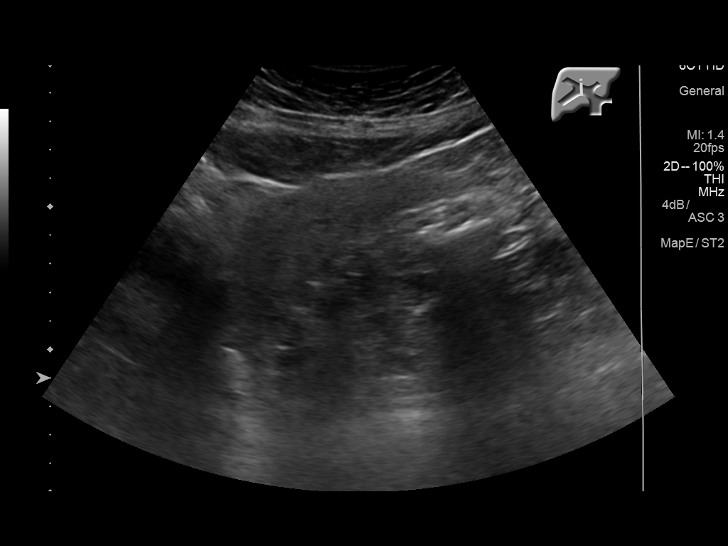
[im 31/38]
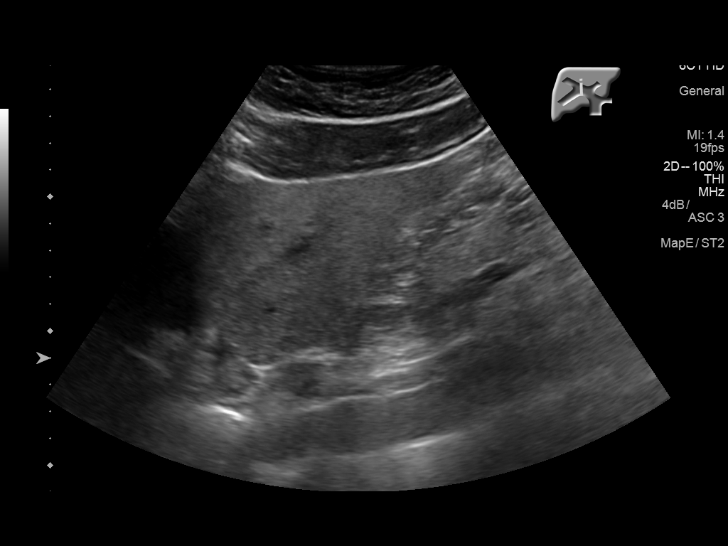
[im 34/38]
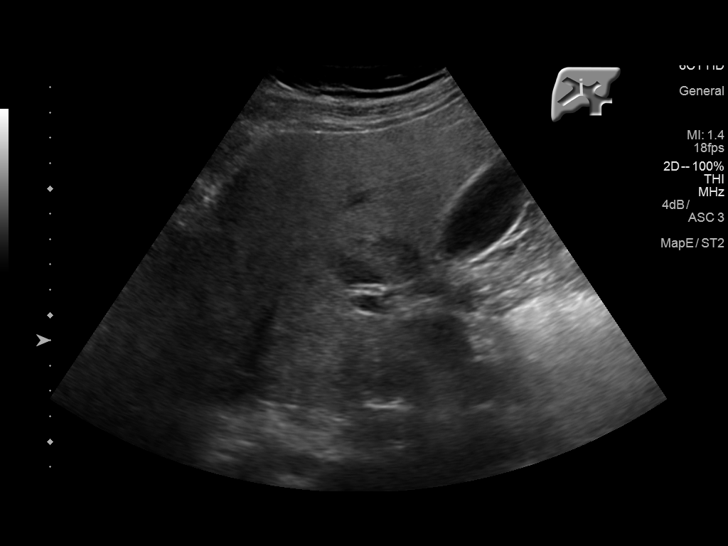
[im 38/38]
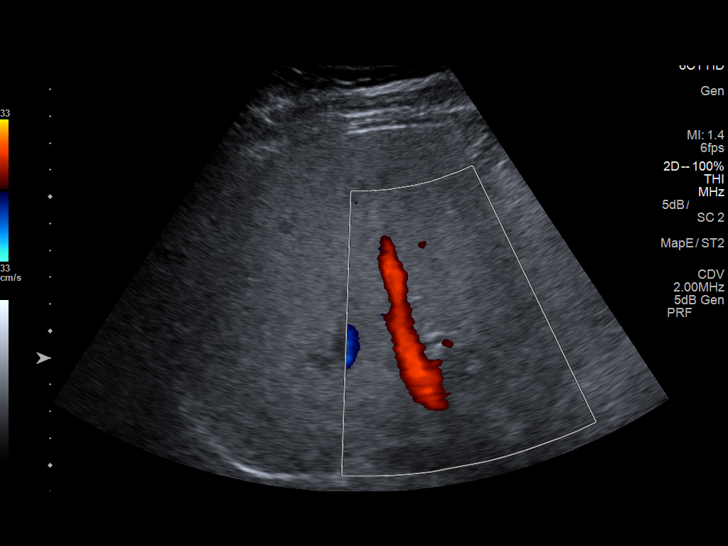

[14 of 25 positions shown; findings below may reference images not displayed]

FINDINGS: Gallbladder:

The gallbladder is visualized and no gallstones are noted. There is
no pain over the gallbladder with compression.

Common bile duct:

Diameter: The common bile duct is normal measuring 3 mm in diameter.

Liver:

The parenchyma of the liver is echogenic and inhomogeneous
consistent with hepatic steatosis with some sparing near the
gallbladder. No focal hepatic abnormality is seen. Portal vein is
patent on color Doppler imaging with normal direction of blood flow
towards the liver.
IMPRESSION: 1. Changes of hepatic steatosis. No focal hepatic abnormality other
than some sparing near the gallbladder.
2. No gallstones.

## 2019-11-18 ENCOUNTER — Other Ambulatory Visit: Payer: Self-pay | Admitting: Nurse Practitioner

## 2019-11-18 ENCOUNTER — Ambulatory Visit (HOSPITAL_COMMUNITY)
Admission: RE | Admit: 2019-11-18 | Discharge: 2019-11-18 | Disposition: A | Discharge status: Home / Self Care | Payer: Medicare Other | Source: Ambulatory Visit | Attending: Pulmonary Disease | Admitting: Pulmonary Disease

## 2019-11-18 DIAGNOSIS — U071 COVID-19: Secondary | ICD-10-CM | POA: Diagnosis present

## 2019-11-18 DIAGNOSIS — Z23 Encounter for immunization: Secondary | ICD-10-CM | POA: Diagnosis not present

## 2019-11-18 MED ORDER — ALBUTEROL SULFATE HFA 108 (90 BASE) MCG/ACT IN AERS: 2.0000 | INHALATION_SPRAY | Freq: Once | RESPIRATORY_TRACT | Status: DC | PRN

## 2019-11-18 MED ORDER — EPINEPHRINE 0.3 MG/0.3ML IJ SOAJ: 0.3000 mg | Freq: Once | INTRAMUSCULAR | Status: DC | PRN

## 2019-11-18 MED ORDER — DIPHENHYDRAMINE HCL 50 MG/ML IJ SOLN: 50.0000 mg | Freq: Once | INTRAMUSCULAR | Status: DC | PRN

## 2019-11-18 MED ORDER — SODIUM CHLORIDE 0.9 % IV SOLN: Freq: Once | INTRAVENOUS | Status: AC

## 2019-11-18 MED ORDER — FAMOTIDINE IN NACL 20-0.9 MG/50ML-% IV SOLN: 20.0000 mg | Freq: Once | INTRAVENOUS | Status: DC | PRN

## 2019-11-18 MED ORDER — SODIUM CHLORIDE 0.9 % IV SOLN: INTRAVENOUS | Status: DC | PRN

## 2019-11-18 MED ORDER — METHYLPREDNISOLONE SODIUM SUCC 125 MG IJ SOLR: 125.0000 mg | Freq: Once | INTRAMUSCULAR | Status: DC | PRN

## 2019-11-18 NOTE — Discharge Instructions (Signed)
10 Things You Can Do to Manage Your COVID-19 Symptoms at Home If you have possible or confirmed COVID-19: 1. Stay home from work and school. And stay away from other public places. If you must go out, avoid using any kind of public transportation, ridesharing, or taxis. 2. Monitor your symptoms carefully. If your symptoms get worse, call your healthcare provider immediately. 3. Get rest and stay hydrated. 4. If you have a medical appointment, call the healthcare provider ahead of time and tell them that you have or may have COVID-19. 5. For medical emergencies, call 911 and notify the dispatch personnel that you have or may have COVID-19. 6. Cover your cough and sneezes with a tissue or use the inside of your elbow. 7. Wash your hands often with soap and water for at least 20 seconds or clean your hands with an alcohol-based hand sanitizer that contains at least 60% alcohol. 8. As much as possible, stay in a specific room and away from other people in your home. Also, you should use a separate bathroom, if available. If you need to be around other people in or outside of the home, wear a mask. 9. Avoid sharing personal items with other people in your household, like dishes, towels, and bedding. 10. Clean all surfaces that are touched often, like counters, tabletops, and doorknobs. Use household cleaning sprays or wipes according to the label instructions. [email or web address] 07/26/2018 This information is not intended to replace advice given to you by your health care provider. Make sure you discuss any questions you have with your health care provider. Document Revised: 12/28/2018 Document Reviewed: 12/28/2018 Elsevier Patient Education  [place].  COVID-19: How to Protect Yourself and Others Know how it spreads  There is currently no vaccine to prevent coronavirus disease 2019 (COVID-19).  The best way to prevent illness is to avoid being exposed to this virus.  The virus is  thought to spread mainly from person-to-person. ? Between people who are in close contact with one another (within about 6 feet). ? Through respiratory droplets produced when an infected person coughs, sneezes or talks. ? These droplets can land in the mouths or noses of people who are nearby or possibly be inhaled into the lungs. ? COVID-19 may be spread by people who are not showing symptoms. Everyone should Clean your hands often  Wash your hands often with soap and water for at least 20 seconds especially after you have been in a public place, or after blowing your nose, coughing, or sneezing.  If soap and water are not readily available, use a hand sanitizer that contains at least 60% alcohol. Cover all surfaces of your hands and rub them together until they feel dry.  Avoid touching your eyes, nose, and mouth with unwashed hands. Avoid close contact  Limit contact with others as much as possible.  Avoid close contact with people who are sick.  Put distance between yourself and other people. ? Remember that some people without symptoms may be able to spread virus. ? This is especially important for people who are at higher risk of getting very [employer]cy Cover your mouth and nose with a mask when around others  You could spread COVID-19 to others even if you do not feel sick.  Everyone should wear a mask in public settings and when around people not living in their household, especially when social distancing is difficult to maintain. ? Masks should not be placed on young children under age 48, anyone who  has trouble breathing, or is unconscious, incapacitated or otherwise unable to remove the mask without assistance.  The mask is meant to protect other people in case you are infected.  Do NOT use a facemask meant for a Dietitian.  Continue to keep about 6 feet between yourself and others. The  mask is not a substitute for social distancing. Cover coughs and sneezes  Always cover your mouth and nose with a tissue when you cough or sneeze or use the inside of your elbow.  Throw used tissues in the trash.  Immediately wash your hands with soap and water for at least 20 seconds. If soap and water are not readily available, clean your hands with a hand sanitizer that contains at least 60% alcohol. Clean and disinfect  Clean AND disinfect frequently touched surfaces daily. This includes tables, doorknobs, light switches, countertops, handles, desks, phones, keyboards, toilets, faucets, and sinks. [email or web address]  If surfaces are dirty, clean them: Use detergent or soap and water prior to disinfection.  Then, use a household disinfectant. You can see a list of EPA-registered household disinfectants here. [email or web address].[email or web address] 09/27/2018 This information is not intended to replace advice given to you by your health care provider. Make sure you discuss any questions you have with your health care provider. Document Revised: 10/05/2018 Document Reviewed: 08/03/2018 Elsevier Patient Education  [place]. What types of side effects do monoclonal antibody drugs cause?  Common side effects  In general, the more common side effects caused by monoclonal antibody drugs include: . Allergic reactions, such as hives or itching . Flu-like signs and symptoms, including chills, fatigue, fever, and muscle aches and pains . Nausea, vomiting . Diarrhea . Skin rashes . Low blood pressure   The CDC is recommending patients who receive monoclonal antibody treatments wait at least 90 days before being vaccinated.  Currently, there are no data on the safety and efficacy of mRNA COVID-19 vaccines in persons who received monoclonal antibodies or convalescent plasma as part of COVID-19 treatment. Based on the estimated half-life of  such therapies as well as evidence suggesting that reinfection is uncommon in the 90 days after initial infection, vaccination should be deferred for at least 90 days, as a precautionary measure until additional information becomes available, to avoid interference of the antibody treatment with vaccine-induced immune responses.

## 2019-11-18 NOTE — Progress Notes (Signed)
  Diagnosis: COVID-19  Physician: Dr Joya Gaskins  Procedure: [facility] Med: bamlanivimab\etesevimab infusion - Provided patient with bamlanimivab\etesevimab fact sheet for patients, parents and caregivers prior to infusion.  Complications: No immediate complications noted.  Discharge: Discharged home   Ashley Murrain 11/18/2019

## 2019-11-18 NOTE — Progress Notes (Signed)
I connected by phone with Steven Shaw on 11/18/2019 at 9:38 AM to discuss the potential use of a new treatment for mild to moderate COVID-19 viral infection in non-hospitalized patients.  This patient is a 44 y.o. male that meets the FDA criteria for Emergency Use Authorization of COVID monoclonal antibody casirivimab/imdevimab or bamlanivimab/eteseviamb.  Has a (+) direct SARS-CoV-2 viral test result  Has mild or moderate COVID-19   Is NOT hospitalized due to COVID-19  Is within 10 days of symptom onset  Has at least one of the high risk factor(s) for progression to severe COVID-19 and/or hospitalization as defined in EUA.  Specific high risk criteria : BMI > 25   I have spoken and communicated the following to the patient or parent/caregiver regarding COVID monoclonal antibody treatment:  1. FDA has authorized the emergency use for the treatment of mild to moderate COVID-19 in adults and pediatric patients with positive results of direct SARS-CoV-2 viral testing who are 5 years of age and older weighing at least 40 kg, and who are at high risk for progressing to severe COVID-19 and/or hospitalization.  2. The significant known and potential risks and benefits of COVID monoclonal antibody, and the extent to which such potential risks and benefits are unknown.  3. Information on available alternative treatments and the risks and benefits of those alternatives, including clinical trials.  4. Patients treated with COVID monoclonal antibody should continue to self-isolate and use infection control measures (e.g., wear mask, isolate, social distance, avoid sharing personal items, clean and disinfect "high touch" surfaces, and frequent handwashing) according to CDC guidelines.   5. The patient or parent/caregiver has the option to accept or refuse COVID monoclonal antibody treatment.  After reviewing this information with the patient, the patient has agreed to receive one of the available  covid 19 monoclonal antibodies and will be provided an appropriate fact sheet prior to infusion. [name], NP 11/18/2019 9:38 AM

## 2021-02-04 ENCOUNTER — Other Ambulatory Visit (HOSPITAL_COMMUNITY): Payer: Self-pay | Admitting: Psychiatry
